# Patient Record
Sex: Male | Born: 1975 | Race: White | Hispanic: No | Marital: Single | Smoking: Never smoker
Health system: Southern US, Community
[De-identification: ages and names within clinical notes are randomized; demographics above are authoritative.]

## PROBLEM LIST (undated history)

## (undated) DIAGNOSIS — E291 Testicular hypofunction: Secondary | ICD-10-CM

## (undated) DIAGNOSIS — E559 Vitamin D deficiency, unspecified: Secondary | ICD-10-CM

## (undated) DIAGNOSIS — N400 Enlarged prostate without lower urinary tract symptoms: Secondary | ICD-10-CM

## (undated) DIAGNOSIS — R7989 Other specified abnormal findings of blood chemistry: Secondary | ICD-10-CM

## (undated) DIAGNOSIS — E785 Hyperlipidemia, unspecified: Secondary | ICD-10-CM

## (undated) DIAGNOSIS — K219 Gastro-esophageal reflux disease without esophagitis: Secondary | ICD-10-CM

## (undated) HISTORY — DX: Vitamin D deficiency, unspecified: E55.9

## (undated) HISTORY — DX: Other specified abnormal findings of blood chemistry: R79.89

## (undated) HISTORY — DX: Testicular hypofunction: E29.1

## (undated) HISTORY — DX: Benign prostatic hyperplasia without lower urinary tract symptoms: N40.0

## (undated) HISTORY — DX: Gastro-esophageal reflux disease without esophagitis: K21.9

## (undated) HISTORY — DX: Hyperlipidemia, unspecified: E78.5

## (undated) HISTORY — PX: SHOULDER SURGERY: SHX246

---

## 2007-04-18 ENCOUNTER — Emergency Department (HOSPITAL_COMMUNITY): Admission: EM | Admit: 2007-04-18 | Discharge: 2007-04-18 | Payer: Self-pay | Admitting: *Deleted

## 2013-04-27 DIAGNOSIS — D229 Melanocytic nevi, unspecified: Secondary | ICD-10-CM

## 2013-04-27 HISTORY — DX: Melanocytic nevi, unspecified: D22.9

## 2015-04-01 ENCOUNTER — Telehealth: Payer: Self-pay | Admitting: *Deleted

## 2015-04-01 DIAGNOSIS — R7989 Other specified abnormal findings of blood chemistry: Secondary | ICD-10-CM

## 2015-04-01 NOTE — Telephone Encounter (Signed)
Spoke w/pt and relayed Shannon's instructions: He will come in on 04/08/15 by 10:30 AM to have labs drawn for testosterone levels.  He is not out of his medication at this time. . .sm

## 2015-04-03 ENCOUNTER — Telehealth: Payer: Self-pay | Admitting: Urology

## 2015-04-03 NOTE — Telephone Encounter (Signed)
Spoke w/Mike at [facility].  Request was for needles and testosterone Rx's.  Request for needles was approved and signed by Michiel CowboyShannon Mcgowan PA-C and [facility]ed to [facility].  Pt is to have testosterone levels checked before refilling this Rx.  He is currently not out of this Rx.

## 2015-04-03 NOTE — Telephone Encounter (Signed)
Caleb Peters from [facility] called about the refill request on testosterone injection. that had been sent in twice for Pt. Best contact # [phone] 04/03/15 MAF

## 2015-04-04 ENCOUNTER — Other Ambulatory Visit: Payer: Self-pay | Admitting: *Deleted

## 2015-04-04 DIAGNOSIS — E291 Testicular hypofunction: Secondary | ICD-10-CM

## 2015-04-04 NOTE — Telephone Encounter (Signed)
Last filled on 04/12/2014.  Last OV 01/28/2015.

## 2015-04-08 ENCOUNTER — Ambulatory Visit: Payer: Commercial Managed Care - PPO

## 2015-04-08 ENCOUNTER — Other Ambulatory Visit: Payer: Commercial Managed Care - PPO

## 2015-04-08 DIAGNOSIS — E291 Testicular hypofunction: Secondary | ICD-10-CM

## 2015-04-09 ENCOUNTER — Telehealth: Payer: Self-pay

## 2015-04-09 DIAGNOSIS — E291 Testicular hypofunction: Secondary | ICD-10-CM

## 2015-04-09 LAB — TESTOSTERONE: Testosterone: 1358 ng/dL — ABNORMAL HIGH (ref 348–1197)

## 2015-04-09 MED ORDER — TESTOSTERONE CYPIONATE 200 MG/ML IM SOLN: 200.0000 mg | INTRAMUSCULAR | Status: DC

## 2015-04-09 NOTE — Telephone Encounter (Signed)
Spoke with pt and gave results. Pt will f/u in 2 weeks for next injection. Medication faxed to pharmacy. Cw,lpn

## 2015-04-09 NOTE — Telephone Encounter (Signed)
-----   Message from Harle Battiest, PA-C sent at 04/09/2015  8:33 AM EDT ----- Patient's testosterone is too high.  We will need to give the injections.  We will start with 0.cc every two weeks and then recheck his testosterone in the am (8-10am) in one month.

## 2015-04-22 ENCOUNTER — Ambulatory Visit: Payer: Self-pay

## 2015-04-26 ENCOUNTER — Ambulatory Visit (INDEPENDENT_AMBULATORY_CARE_PROVIDER_SITE_OTHER): Payer: Commercial Managed Care - PPO

## 2015-04-26 DIAGNOSIS — E291 Testicular hypofunction: Secondary | ICD-10-CM | POA: Diagnosis not present

## 2015-04-26 MED ORDER — TESTOSTERONE CYPIONATE 200 MG/ML IM SOLN
100.0000 mg | Freq: Once | INTRAMUSCULAR | Status: AC
  Administered 2015-04-26: 100 mg via INTRAMUSCULAR

## 2015-04-26 NOTE — Progress Notes (Signed)
Testosterone IM Injection  Due to Hypogonadism patient is present today for a Testosterone Injection.  Medication: Testosterone Cypionate Dose: 0.615ml /100mg  Location: left upper outer buttocks Lot: [id number] Exp:01/2016  Patient tolerated well, no complications were noted  Preformed by: Eligha BridegroomSarah Watts, CMA  Follow up: 2wk injection

## 2015-05-13 ENCOUNTER — Ambulatory Visit (INDEPENDENT_AMBULATORY_CARE_PROVIDER_SITE_OTHER): Payer: Commercial Managed Care - PPO

## 2015-05-13 DIAGNOSIS — E291 Testicular hypofunction: Secondary | ICD-10-CM

## 2015-05-13 MED ORDER — TESTOSTERONE CYPIONATE 200 MG/ML IM SOLN
200.0000 mg | Freq: Once | INTRAMUSCULAR | Status: AC
  Administered 2015-05-13: 200 mg via INTRAMUSCULAR

## 2015-05-13 NOTE — Progress Notes (Signed)
Testosterone IM Injection  Due to Hypogonadism patient is present today for a Testosterone Injection.  Medication: Testosterone Cypionate Dose: 0.285mL Location: right upper outer buttocks Lot: [id number] Exp:01/2016  Patient tolerated well, no complications were noted  Preformed by: Rupert Stackshelsea Jerlisa Diliberto, LPN   Follow up: 2 weeks

## 2015-05-27 ENCOUNTER — Ambulatory Visit (INDEPENDENT_AMBULATORY_CARE_PROVIDER_SITE_OTHER): Payer: Commercial Managed Care - PPO

## 2015-05-27 DIAGNOSIS — E291 Testicular hypofunction: Secondary | ICD-10-CM

## 2015-05-27 MED ORDER — TESTOSTERONE CYPIONATE 200 MG/ML IM SOLN
200.0000 mg | Freq: Once | INTRAMUSCULAR | Status: AC
  Administered 2015-05-27: 200 mg via INTRAMUSCULAR

## 2015-05-27 NOTE — Progress Notes (Signed)
Testosterone IM Injection  Due to Hypogonadism patient is present today for a Testosterone Injection.  Medication: Testosterone Cypionate Dose: 0.96mL Location: left upper outer buttocks Lot: [id number] Exp:01/2016  Patient tolerated well, no complications were noted  Preformed by: Rupert Stacks, LPN  Follow up: 2 weeks

## 2015-06-10 ENCOUNTER — Ambulatory Visit (INDEPENDENT_AMBULATORY_CARE_PROVIDER_SITE_OTHER): Payer: Commercial Managed Care - PPO

## 2015-06-10 DIAGNOSIS — E291 Testicular hypofunction: Secondary | ICD-10-CM | POA: Diagnosis not present

## 2015-06-10 MED ORDER — TESTOSTERONE CYPIONATE 200 MG/ML IM SOLN
200.0000 mg | Freq: Once | INTRAMUSCULAR | Status: AC
  Administered 2015-06-10: 200 mg via INTRAMUSCULAR

## 2015-06-10 NOTE — Progress Notes (Signed)
Testosterone IM Injection  Due to Hypogonadism patient is present today for a Testosterone Injection.  Medication: Testosterone Cypionate Dose: 0.75mL Location: right upper outer buttocks Lot: [id number] Exp:01/2016  Patient tolerated well, no complications were noted  Preformed by: Rupert Stacks, LPN  Follow up: Pt needs more testosterone before next injection. Pt made aware. Pt stated he has 1 more refill.

## 2015-06-24 ENCOUNTER — Ambulatory Visit: Payer: Commercial Managed Care - PPO

## 2015-06-28 ENCOUNTER — Ambulatory Visit: Payer: Commercial Managed Care - PPO

## 2015-07-02 ENCOUNTER — Ambulatory Visit: Payer: Commercial Managed Care - PPO

## 2015-07-08 ENCOUNTER — Ambulatory Visit (INDEPENDENT_AMBULATORY_CARE_PROVIDER_SITE_OTHER): Payer: Commercial Managed Care - PPO

## 2015-07-08 DIAGNOSIS — E291 Testicular hypofunction: Secondary | ICD-10-CM

## 2015-07-08 MED ORDER — TESTOSTERONE CYPIONATE 200 MG/ML IM SOLN
200.0000 mg | Freq: Once | INTRAMUSCULAR | Status: AC
  Administered 2015-07-08: 200 mg via INTRAMUSCULAR

## 2015-07-08 NOTE — Progress Notes (Signed)
Testosterone IM Injection  Due to Hypogonadism patient is present today for a Testosterone Injection.  Medication: Testosterone Cypionate Dose: 0.78mL Location: left quadricep Lot: [id number] Exp:04/2016  Patient tolerated well, no complications were noted  Preformed by: Rupert Stacks, LPN

## 2015-07-22 ENCOUNTER — Ambulatory Visit (INDEPENDENT_AMBULATORY_CARE_PROVIDER_SITE_OTHER): Payer: Commercial Managed Care - PPO

## 2015-07-22 DIAGNOSIS — E291 Testicular hypofunction: Secondary | ICD-10-CM

## 2015-07-22 MED ORDER — TESTOSTERONE CYPIONATE 200 MG/ML IM SOLN
200.0000 mg | Freq: Once | INTRAMUSCULAR | Status: AC
  Administered 2015-07-22: 200 mg via INTRAMUSCULAR

## 2015-07-22 NOTE — Progress Notes (Signed)
Testosterone IM Injection  Due to Hypogonadism patient is present today for a Testosterone Injection.  Medication: Testosterone Cypionate Dose: 0.39mL Location: right upper outer quadricept Lot: [id number] Exp:04/2016  Patient tolerated well, no complications were noted  Preformed by: Rupert Stacks, LPN  Follow up: 2 weeks

## 2015-07-23 ENCOUNTER — Telehealth: Payer: Self-pay | Admitting: *Deleted

## 2015-07-23 ENCOUNTER — Other Ambulatory Visit: Payer: Self-pay | Admitting: *Deleted

## 2015-07-23 DIAGNOSIS — E291 Testicular hypofunction: Secondary | ICD-10-CM

## 2015-07-23 NOTE — Telephone Encounter (Signed)
Because of  patient non compliance with every 3 month lab draws while on testosterone, per [facility] Patient is not to receive any more injections and will follow up in one month with a before 9 am lab draw for Testosterone and a Hematocrit and after results Carollee Herter will proceed with what will be his plan.

## 2015-08-05 ENCOUNTER — Ambulatory Visit: Payer: Self-pay

## 2015-08-21 ENCOUNTER — Other Ambulatory Visit: Payer: Commercial Managed Care - PPO

## 2015-09-06 ENCOUNTER — Other Ambulatory Visit: Payer: Commercial Managed Care - PPO

## 2015-11-01 ENCOUNTER — Telehealth: Payer: Self-pay | Admitting: Urology

## 2015-11-01 NOTE — Telephone Encounter (Signed)
Pt called, has new insurance and would like to start back on his testosterone injections.  Does the patient need to come in for a testosterone level and/or appt with Carollee HerterShannon before he starts back on his injections.  Please call and advise.  [phone].

## 2015-11-01 NOTE — Telephone Encounter (Signed)
LMOM

## 2015-11-01 NOTE — Telephone Encounter (Signed)
We have new protocols and place for testosterone therapy due to the FDA's increased regulation of testosterone therapy.  He will need to come in for blood work 1 week prior to an office visit.  He will need a TSH, lipid panel, hepatic panel, PSA, hematocrit and a testosterone level before 9 AM.  These do not need to be fasting labs.  He then will need an appointment with me or Lillia AbedLindsay.

## 2015-11-05 NOTE — Telephone Encounter (Signed)
LMOM for patient to call back.

## 2015-11-06 ENCOUNTER — Other Ambulatory Visit: Payer: Self-pay | Admitting: Urology

## 2015-11-06 ENCOUNTER — Telehealth: Payer: Self-pay | Admitting: Urology

## 2015-11-06 DIAGNOSIS — E291 Testicular hypofunction: Secondary | ICD-10-CM

## 2015-11-06 NOTE — Telephone Encounter (Signed)
I spoke w/Darlene at Dr. Carron BrazenNiemeyer's office and pt needs Thyroid, Cholesterol, Vitamin D and CBC.  He is coming Friday morning at 8:45 for labs.

## 2015-11-08 ENCOUNTER — Other Ambulatory Visit: Payer: 59

## 2015-11-08 DIAGNOSIS — E291 Testicular hypofunction: Secondary | ICD-10-CM

## 2015-11-09 LAB — CBC WITH DIFFERENTIAL/PLATELET
BASOS ABS: 0 10*3/uL (ref 0.0–0.2)
BASOS: 0 %
EOS (ABSOLUTE): 0.2 10*3/uL (ref 0.0–0.4)
Eos: 4 %
HEMOGLOBIN: 15.9 g/dL (ref 12.6–17.7)
Hematocrit: 45 % (ref 37.5–51.0)
Immature Grans (Abs): 0 10*3/uL (ref 0.0–0.1)
Immature Granulocytes: 0 %
LYMPHS ABS: 1.6 10*3/uL (ref 0.7–3.1)
Lymphs: 32 %
MCH: 32.1 pg (ref 26.6–33.0)
MCHC: 35.3 g/dL (ref 31.5–35.7)
MCV: 91 fL (ref 79–97)
MONOCYTES: 5 %
Monocytes Absolute: 0.3 10*3/uL (ref 0.1–0.9)
NEUTROS ABS: 3.1 10*3/uL (ref 1.4–7.0)
Neutrophils: 59 %
Platelets: 172 10*3/uL (ref 150–379)
RBC: 4.95 x10E6/uL (ref 4.14–5.80)
RDW: 12.5 % (ref 12.3–15.4)
WBC: 5.2 10*3/uL (ref 3.4–10.8)

## 2015-11-09 LAB — COMPREHENSIVE METABOLIC PANEL
A/G RATIO: 2.5 (ref 1.1–2.5)
ALBUMIN: 5 g/dL (ref 3.5–5.5)
ALT: 30 IU/L (ref 0–44)
AST: 26 IU/L (ref 0–40)
Alkaline Phosphatase: 53 IU/L (ref 39–117)
BILIRUBIN TOTAL: 0.7 mg/dL (ref 0.0–1.2)
BUN / CREAT RATIO: 16 (ref 9–20)
BUN: 18 mg/dL (ref 6–24)
CALCIUM: 9.7 mg/dL (ref 8.7–10.2)
CHLORIDE: 101 mmol/L (ref 96–106)
CO2: 25 mmol/L (ref 18–29)
Creatinine, Ser: 1.12 mg/dL (ref 0.76–1.27)
GFR, EST AFRICAN AMERICAN: 94 mL/min/{1.73_m2} (ref >59)
GFR, EST NON AFRICAN AMERICAN: 82 mL/min/{1.73_m2} (ref >59)
GLUCOSE: 85 mg/dL (ref 65–99)
Globulin, Total: 2 g/dL (ref 1.5–4.5)
Potassium: 4.2 mmol/L (ref 3.5–5.2)
Sodium: 142 mmol/L (ref 134–144)
TOTAL PROTEIN: 7 g/dL (ref 6.0–8.5)

## 2015-11-09 LAB — LIPID PANEL
Chol/HDL Ratio: 2.2 ratio units (ref 0.0–5.0)
Cholesterol, Total: 195 mg/dL (ref 100–199)
HDL: 87 mg/dL (ref >39)
LDL Calculated: 85 mg/dL (ref 0–99)
TRIGLYCERIDES: 113 mg/dL (ref 0–149)
VLDL Cholesterol Cal: 23 mg/dL (ref 5–40)

## 2015-11-09 LAB — PSA: PROSTATE SPECIFIC AG, SERUM: 0.3 ng/mL (ref 0.0–4.0)

## 2015-11-09 LAB — TESTOSTERONE: Testosterone: 266 ng/dL — ABNORMAL LOW (ref 348–1197)

## 2015-11-09 LAB — TSH: TSH: 2.56 u[IU]/mL (ref 0.450–4.500)

## 2015-11-11 ENCOUNTER — Encounter: Payer: Self-pay | Admitting: *Deleted

## 2015-11-11 NOTE — Telephone Encounter (Signed)
Spoke with patient and gave message patient already came for blood work and has appointment tomorrow to talk with Carollee HerterShannon about restarting injections. Patient ok with plan

## 2015-11-12 ENCOUNTER — Encounter: Payer: Self-pay | Admitting: Urology

## 2015-11-12 ENCOUNTER — Ambulatory Visit (INDEPENDENT_AMBULATORY_CARE_PROVIDER_SITE_OTHER): Payer: 59 | Admitting: Urology

## 2015-11-12 VITALS — BP 129/88 | HR 65 | Ht 74.0 in | Wt 225.8 lb

## 2015-11-12 DIAGNOSIS — E291 Testicular hypofunction: Secondary | ICD-10-CM | POA: Insufficient documentation

## 2015-11-12 LAB — VITAMIN D 1,25 DIHYDROXY
Vitamin D 1, 25 (OH)2 Total: 70 pg/mL
Vitamin D2 1, 25 (OH)2: <10 pg/mL
Vitamin D3 1, 25 (OH)2: 68 pg/mL

## 2015-11-12 MED ORDER — TESTOSTERONE CYPIONATE 200 MG/ML IM SOLN
200.0000 mg | Freq: Once | INTRAMUSCULAR | Status: AC
  Administered 2015-11-12: 200 mg via INTRAMUSCULAR

## 2015-11-12 NOTE — Progress Notes (Signed)
11/12/2015 10:09 AM   Caleb Peters 04-25-1976 [id number]  Referring provider: Domenic Schwab, FNP [address] [place], [place] [address]  Chief Complaint  Patient presents with  . Hypogonadism    discuss injections    HPI: Patient is a 40 year old Caucasian male with a history of hypogonadism who was managing his condition with testosterone cypionate injections but lost his insurance and had to discontinue therapy.  He would like to restart the injections at this time.    Hypogonadism Patient is with the symptoms of decreased energy and motivation and mood changes.  This is indicated by his responses to the ADAM questionnaire.   His current testosterone level is 266 ng/dL on 57/84/6962.  He was currently managing his hypogonadism with testosterone cypionate injections and would like to reinstate this therapy.   He would like to have his injections every other Monday.   His TSH, lipid panel, LFT, PSA and HCT are all within normal limits.      Androgen Deficiency in the Aging Male      11/12/15 0900       Androgen Deficiency in the Aging Male   Do you have a decrease in libido (sex drive) No     Do you have lack of energy Yes     Do you have a decrease in strength and/or endurance Yes     Have you lost height No     Have you noticed a decreased "enjoyment of life" No     Are you sad and/or grumpy No     Are your erections less strong No     Have you noticed a recent deterioration in your ability to play sports No     Are you falling asleep after dinner No     Has there been a recent deterioration in your work performance No        He is not experiencing any difficulty with sexual function.  He is not experiencing any urinary symptoms at this time.  His IPSS score is 0/0.      IPSS      11/12/15 0900       International Prostate Symptom Score   How often have you had the sensation of not emptying your bladder? Not at All     How often have you had to urinate  less than every two hours? Not at All     How often have you found you stopped and started again several times when you urinated? Not at All     How often have you found it difficult to postpone urination? Not at All     How often have you had a weak urinary stream? Not at All     How often have you had to strain to start urination? Not at All     How many times did you typically get up at night to urinate? None     Total IPSS Score 0     Quality of Life due to urinary symptoms   If you were to spend the rest of your life with your urinary condition just the way it is now how would you feel about that? Delighted        Score:  1-7 Mild 8-19 Moderate 20-35 Severe   PMH: Past Medical History  Diagnosis Date  . HLD (hyperlipidemia)   . Esophageal reflux   . Vitamin D deficiency   . Low testosterone   . Hypogonadism  in male   . BPH (benign prostatic hypertrophy)     Surgical History: Past Surgical History  Procedure Laterality Date  . Shoulder surgery      Home Medications:    Medication List       This list is accurate as of: 11/12/15 10:09 AM.  Always use your most recent med list.               lisinopril 10 MG tablet  Commonly known as:  PRINIVIL,ZESTRIL  Take 10 mg by mouth daily.     rosuvastatin 10 MG tablet  Commonly known as:  CRESTOR  Take 10 mg by mouth daily.     testosterone cypionate 200 MG/ML injection  Commonly known as:  DEPOTESTOSTERONE CYPIONATE  Inject 1 mL (200 mg total) into the muscle every 14 (fourteen) days. Inject 0.79mL every 2 weeks        Allergies: No Known Allergies  Family History: Family History  Problem Relation Age of Onset  . Cancer Mother   . Hyperlipidemia Mother   . Prostate cancer Maternal Grandfather     Social History:  reports that he has never smoked. He does not have any smokeless tobacco history on file. He reports that he drinks alcohol. He reports that he does not use illicit  drugs.  ROS: UROLOGY Frequent Urination?: No Hard to postpone urination?: No Burning/pain with urination?: No Get up at night to urinate?: No Leakage of urine?: No Urine stream starts and stops?: No Trouble starting stream?: No Do you have to strain to urinate?: No Blood in urine?: No Urinary tract infection?: No Sexually transmitted disease?: No Injury to kidneys or bladder?: No Painful intercourse?: No Weak stream?: No Erection problems?: No Penile pain?: No  Gastrointestinal Nausea?: No Vomiting?: No Indigestion/heartburn?: No Diarrhea?: No Constipation?: No  Constitutional Fever: No Night sweats?: No Weight loss?: No Fatigue?: No  Skin Skin rash/lesions?: No Itching?: No  Eyes Blurred vision?: No Double vision?: No  Ears/Nose/Throat Sore throat?: No Sinus problems?: No  Hematologic/Lymphatic Swollen glands?: No Easy bruising?: No  Cardiovascular Leg swelling?: No Chest pain?: No  Respiratory Cough?: No Shortness of breath?: No  Endocrine Excessive thirst?: No  Musculoskeletal Back pain?: No Joint pain?: No  Neurological Headaches?: No Dizziness?: No  Psychologic Depression?: No Anxiety?: No  Physical Exam: BP 129/88 mmHg  Pulse 65  Ht  (1.88 m)  Wt 225 lb 12.8 oz (102.422 kg)  BMI 28.98 kg/m2  Constitutional: Well nourished. Alert and oriented, No acute distress. HEENT: [place] AT, moist mucus membranes. Trachea midline, no masses. Cardiovascular: No clubbing, cyanosis, or edema. Respiratory: Normal respiratory effort, no increased work of breathing. GI: Abdomen is soft, non tender, non distended, no abdominal masses. Liver and spleen not palpable.  No hernias appreciated.  Stool sample for occult testing is not indicated.   GU: No CVA tenderness.  No bladder fullness or masses.  Patient with circumcised phallus.   Urethral meatus is patent.  No penile discharge. No penile lesions or rashes. Scrotum without lesions, cysts,  rashes and/or edema.  Testicles are located scrotally bilaterally. No masses are appreciated in the testicles. Left and right epididymis are normal. Rectal: Patient with  normal sphincter tone. Anus and perineum without scarring or rashes. No rectal masses are appreciated. Prostate is approximately 35 grams, no nodules are appreciated. Seminal vesicles are normal. Skin: No rashes, bruises or suspicious lesions. Lymph: No cervical or inguinal adenopathy. Neurologic: Grossly intact, no focal deficits, moving all 4 extremities.  Psychiatric: Normal mood and affect.  Laboratory Data: Lab Results  Component Value Date   WBC 5.2 11/08/2015   HCT 45.0 11/08/2015   MCV 91 11/08/2015   PLT 172 11/08/2015    Lab Results  Component Value Date   CREATININE 1.12 11/08/2015    Lab Results  Component Value Date   TESTOSTERONE 266* 11/08/2015    Lab Results  Component Value Date   TSH 2.560 11/08/2015   Lab Results  Component Value Date   AST 26 11/08/2015   Lab Results  Component Value Date   ALT 30 11/08/2015    Assessment & Plan:    1. Hypogonadism:   Patient will reinstate his testosterone cypionate injections today. He will receive 1 cc. He would like injections to occur every other Monday as this is convenient to his work schedule.  He will return in 3 months for hematocrit and testosterone level blood draw. In 6 months, he will return for an office visit with a providers for I PSS score, ADAM questionnaire, exam, PSA, hematocrit and testosterone level  Return in about 3 months (around 02/10/2016) for HCT and testosterone level.  These notes generated with voice recognition software. I apologize for typographical errors.  Michiel Cowboy, PA-C  [facility] [address], [address] [place], [place] [address] [phone]

## 2015-11-12 NOTE — Progress Notes (Signed)
Testosterone IM Injection  Due to Hypogonadism patient is present today for a Testosterone Injection.  Medication: Testosterone Cypionate Dose: 0.98mL Location: left upper outer buttocks Lot: [id number] Exp:04/2016  Patient tolerated well, no complications were noted  Preformed by: Rupert Stacks, LPN

## 2015-11-25 ENCOUNTER — Ambulatory Visit (INDEPENDENT_AMBULATORY_CARE_PROVIDER_SITE_OTHER): Payer: 59

## 2015-11-25 DIAGNOSIS — E291 Testicular hypofunction: Secondary | ICD-10-CM

## 2015-11-25 MED ORDER — TESTOSTERONE CYPIONATE 200 MG/ML IM SOLN
200.0000 mg | Freq: Once | INTRAMUSCULAR | Status: AC
  Administered 2015-11-25: 200 mg via INTRAMUSCULAR

## 2015-11-25 NOTE — Progress Notes (Signed)
Testosterone IM Injection  Due to Hypogonadism patient is present today for a Testosterone Injection.  Medication: Testosterone Cypionate Dose: 1mL Location: right upper outer buttocks Lot: [id number] Exp:04/2016  Patient tolerated well, no complications were noted  Preformed by: Rupert Stacks, LPN

## 2015-12-09 ENCOUNTER — Ambulatory Visit (INDEPENDENT_AMBULATORY_CARE_PROVIDER_SITE_OTHER): Payer: 59

## 2015-12-09 DIAGNOSIS — E291 Testicular hypofunction: Secondary | ICD-10-CM | POA: Diagnosis not present

## 2015-12-09 MED ORDER — TESTOSTERONE CYPIONATE 200 MG/ML IM SOLN
200.0000 mg | Freq: Once | INTRAMUSCULAR | Status: AC
  Administered 2015-12-09: 200 mg via INTRAMUSCULAR

## 2015-12-09 NOTE — Progress Notes (Signed)
Testosterone IM Injection  Due to Hypogonadism patient is present today for a Testosterone Injection.  Medication: Testosterone Cypionate Dose: 1mL Location: left upper outer buttocks Lot: [id number] Exp:04/2016  Patient tolerated well, no complications were noted  Preformed by: Rupert Stacks, LPN   Follow up: 2 weeks

## 2015-12-23 ENCOUNTER — Ambulatory Visit (INDEPENDENT_AMBULATORY_CARE_PROVIDER_SITE_OTHER): Payer: 59

## 2015-12-23 DIAGNOSIS — E291 Testicular hypofunction: Secondary | ICD-10-CM | POA: Diagnosis not present

## 2015-12-23 MED ORDER — TESTOSTERONE CYPIONATE 200 MG/ML IM SOLN
200.0000 mg | Freq: Once | INTRAMUSCULAR | Status: AC
  Administered 2015-12-23: 200 mg via INTRAMUSCULAR

## 2015-12-23 NOTE — Progress Notes (Signed)
Testosterone IM Injection  Due to Hypogonadism patient is present today for a Testosterone Injection.  Medication: Testosterone Cypionate Dose: 1mL Location: right upper outer buttocks Lot: [id number] Exp:04/2016  Patient tolerated well, no complications were noted  Preformed by: Rupert Stacks, LPN   Follow up: 2 weeks

## 2016-01-06 ENCOUNTER — Ambulatory Visit: Payer: 59

## 2016-01-20 ENCOUNTER — Ambulatory Visit (INDEPENDENT_AMBULATORY_CARE_PROVIDER_SITE_OTHER): Payer: 59

## 2016-01-20 DIAGNOSIS — E291 Testicular hypofunction: Secondary | ICD-10-CM | POA: Diagnosis not present

## 2016-01-20 MED ORDER — TESTOSTERONE CYPIONATE 200 MG/ML IM SOLN
200.0000 mg | Freq: Once | INTRAMUSCULAR | Status: AC
  Administered 2016-01-20: 200 mg via INTRAMUSCULAR

## 2016-01-20 NOTE — Progress Notes (Signed)
Testosterone IM Injection  Due to Hypogonadism patient is present today for a Testosterone Injection.  Medication: Testosterone Cypionate Dose: 1mL Location: right upper outer buttocks Lot: [id number] Exp:04/2016  Patient tolerated well, no complications were noted  Preformed by: Rupert Stackshelsea Concettina Leth, LPN   Follow up: 2 weeks

## 2016-02-03 ENCOUNTER — Ambulatory Visit (INDEPENDENT_AMBULATORY_CARE_PROVIDER_SITE_OTHER): Payer: 59

## 2016-02-03 DIAGNOSIS — E291 Testicular hypofunction: Secondary | ICD-10-CM

## 2016-02-03 MED ORDER — TESTOSTERONE CYPIONATE 200 MG/ML IM SOLN
200.0000 mg | Freq: Once | INTRAMUSCULAR | Status: AC
  Administered 2016-02-03: 200 mg via INTRAMUSCULAR

## 2016-02-03 NOTE — Progress Notes (Signed)
Testosterone IM Injection  Due to Hypogonadism patient is present today for a Testosterone Injection.  Medication: Testosterone Cypionate Dose: 1ml Location: left upper outer buttocks Lot: [id number]  Exp:04/2016  Patient tolerated well, no complications were noted  Preformed by: K.Deziree Mokry,CMA  Follow up: 2 weeks

## 2016-02-17 ENCOUNTER — Other Ambulatory Visit: Payer: Self-pay

## 2016-02-17 ENCOUNTER — Ambulatory Visit: Payer: 59

## 2016-02-17 DIAGNOSIS — E291 Testicular hypofunction: Secondary | ICD-10-CM

## 2016-02-17 MED ORDER — TESTOSTERONE CYPIONATE 200 MG/ML IM SOLN
200.0000 mg | Freq: Once | INTRAMUSCULAR | Status: AC
  Administered 2016-02-17: 200 mg via INTRAMUSCULAR

## 2016-02-17 NOTE — Progress Notes (Unsigned)
Testosterone IM Injection  Due to Hypogonadism patient is present today for a Testosterone Injection.  Medication: Testosterone Cypionate Dose: 1mL Location: right upper outer buttocks Lot: [id number] Exp: 04/2016  Patient tolerated well, no complications were noted  Preformed by: Anson Oregonachelle Heith Haigler, CMA  Follow up: 2 weeks.

## 2016-02-18 ENCOUNTER — Telehealth: Payer: Self-pay

## 2016-02-18 DIAGNOSIS — E291 Testicular hypofunction: Secondary | ICD-10-CM

## 2016-02-18 LAB — TESTOSTERONE: TESTOSTERONE: 289 ng/dL — AB (ref 348–1197)

## 2016-02-18 LAB — HEMATOCRIT: HEMATOCRIT: 49.6 % (ref 37.5–51.0)

## 2016-02-18 NOTE — Telephone Encounter (Signed)
-----   Message from Harle BattiestShannon A McGowan, PA-C sent at 02/18/2016  8:12 AM EDT ----- Patient's testosterone level in low, but the blood was drawn during his trough.  I need to have him have his blood drawn one week after he receives his shot.  It does not need to be a morning blood draw.

## 2016-02-18 NOTE — Telephone Encounter (Signed)
Spoke with pt in reference to testosterone results. Pt will RTC next Monday for labs.

## 2016-02-24 ENCOUNTER — Other Ambulatory Visit: Payer: 59

## 2016-02-24 DIAGNOSIS — E291 Testicular hypofunction: Secondary | ICD-10-CM

## 2016-02-25 ENCOUNTER — Telehealth: Payer: Self-pay | Admitting: Urology

## 2016-02-25 ENCOUNTER — Telehealth: Payer: Self-pay

## 2016-02-25 LAB — TESTOSTERONE: Testosterone: 726 ng/dL (ref 348–1197)

## 2016-02-25 NOTE — Telephone Encounter (Signed)
LMOM- labs are within normal range. Will need an office visit in July.

## 2016-02-25 NOTE — Telephone Encounter (Signed)
Pt would like for you to give him a call with testosterone results.

## 2016-02-25 NOTE — Telephone Encounter (Signed)
-----   Message from Harle BattiestShannon A McGowan, PA-C sent at 02/25/2016 11:54 AM EDT ----- Testosterone is good. Continue present dose.  I will need to see him in July for an office visit with an ADAM, SHIM, IPSS, PSA, HCT and testostorone level.  Blood work should be obtained one week after he receives his injection.

## 2016-03-02 ENCOUNTER — Ambulatory Visit: Payer: 59

## 2016-03-09 ENCOUNTER — Ambulatory Visit (INDEPENDENT_AMBULATORY_CARE_PROVIDER_SITE_OTHER): Payer: 59

## 2016-03-09 ENCOUNTER — Telehealth: Payer: Self-pay

## 2016-03-09 DIAGNOSIS — E291 Testicular hypofunction: Secondary | ICD-10-CM

## 2016-03-09 MED ORDER — TESTOSTERONE CYPIONATE 200 MG/ML IM SOLN
200.0000 mg | Freq: Once | INTRAMUSCULAR | Status: AC
  Administered 2016-03-09: 200 mg via INTRAMUSCULAR

## 2016-03-09 NOTE — Progress Notes (Signed)
Testosterone IM Injection  Due to Hypogonadism patient is present today for a Testosterone Injection.  Medication: Testosterone Cypionate Dose: 1mL Location: right upper outer buttocks Lot: [id number] Exp:04/2016  Patient tolerated well, no complications were noted  Preformed by: Rupert Stackshelsea Watkins, LPN   Follow up: Pt needs a refill on medication. Office visit is on the books.

## 2016-03-09 NOTE — Telephone Encounter (Signed)
Pt came in today for an injection. Pt needs a refill on medication. Labs were just drawn and a f/u is on the books. Please advise.

## 2016-03-09 NOTE — Telephone Encounter (Signed)
I would like to wait on the labs.

## 2016-03-11 MED ORDER — TESTOSTERONE CYPIONATE 200 MG/ML IM SOLN: 200.0000 mg | INTRAMUSCULAR | Status: DC

## 2016-03-11 NOTE — Telephone Encounter (Signed)
Labs were drawn on 02/24/16.

## 2016-03-11 NOTE — Telephone Encounter (Signed)
Done

## 2016-03-30 ENCOUNTER — Ambulatory Visit (INDEPENDENT_AMBULATORY_CARE_PROVIDER_SITE_OTHER): Payer: 59

## 2016-03-30 DIAGNOSIS — E291 Testicular hypofunction: Secondary | ICD-10-CM | POA: Diagnosis not present

## 2016-03-30 MED ORDER — TESTOSTERONE CYPIONATE 200 MG/ML IM SOLN
200.0000 mg | Freq: Once | INTRAMUSCULAR | Status: AC
  Administered 2016-03-30: 200 mg via INTRAMUSCULAR

## 2016-03-30 NOTE — Progress Notes (Signed)
Testosterone IM Injection  Due to Hypogonadism patient is present today for a Testosterone Injection.  Medication: Testosterone Cypionate Dose: 1mL Location: left upper outer buttocks Lot: [id number] Exp:09/2017  Patient tolerated well, no complications were noted  Preformed by: Freddrick Gladson, LPN     

## 2016-04-13 ENCOUNTER — Ambulatory Visit (INDEPENDENT_AMBULATORY_CARE_PROVIDER_SITE_OTHER): Payer: 59

## 2016-04-13 ENCOUNTER — Ambulatory Visit: Payer: 59

## 2016-04-13 DIAGNOSIS — E291 Testicular hypofunction: Secondary | ICD-10-CM

## 2016-04-13 MED ORDER — TESTOSTERONE CYPIONATE 200 MG/ML IM SOLN
200.0000 mg | Freq: Once | INTRAMUSCULAR | Status: AC
  Administered 2016-04-13: 200 mg via INTRAMUSCULAR

## 2016-04-13 NOTE — Progress Notes (Signed)
Testosterone IM Injection  Due to Hypogonadism patient is present today for a Testosterone Injection.  Medication: Testosterone Cypionate Dose: 1mL Location: right upper outer buttocks Lot: [id number]  Exp:09/2017  Patient tolerated well, no complications were noted  Preformed by: K.Damain Broadus,CMA  Follow up: 1 week f/u labs (Testosterone ). 2 weeks f/u with Depo injection.   Pt informed he needs to get a refill of his medication when he returns as well for his injction.

## 2016-04-20 ENCOUNTER — Other Ambulatory Visit: Payer: 59

## 2016-04-20 DIAGNOSIS — Z79899 Other long term (current) drug therapy: Secondary | ICD-10-CM

## 2016-04-20 DIAGNOSIS — E291 Testicular hypofunction: Secondary | ICD-10-CM

## 2016-04-21 ENCOUNTER — Telehealth: Payer: Self-pay

## 2016-04-21 DIAGNOSIS — E291 Testicular hypofunction: Secondary | ICD-10-CM

## 2016-04-21 LAB — TESTOSTERONE: TESTOSTERONE: 910 ng/dL (ref 348–1197)

## 2016-04-21 LAB — HEPATIC FUNCTION PANEL
ALT: 22 IU/L (ref 0–44)
AST: 34 IU/L (ref 0–40)
Albumin: 4.7 g/dL (ref 3.5–5.5)
Alkaline Phosphatase: 58 IU/L (ref 39–117)
BILIRUBIN, DIRECT: 0.21 mg/dL (ref 0.00–0.40)
Bilirubin Total: 0.8 mg/dL (ref 0.0–1.2)
TOTAL PROTEIN: 6.8 g/dL (ref 6.0–8.5)

## 2016-04-21 LAB — ESTRADIOL: ESTRADIOL: 46.8 pg/mL — AB (ref 7.6–42.6)

## 2016-04-21 LAB — PSA: PROSTATE SPECIFIC AG, SERUM: 0.5 ng/mL (ref 0.0–4.0)

## 2016-04-21 MED ORDER — TESTOSTERONE CYPIONATE 200 MG/ML IM SOLN: 200.0000 mg | INTRAMUSCULAR | Status: AC

## 2016-04-21 NOTE — Telephone Encounter (Signed)
Okay to refill? 

## 2016-04-21 NOTE — Telephone Encounter (Signed)
Spoke with pt and made aware of lab results. Pt voiced understanding. Pt stated that he is out of medication. Please advise.

## 2016-04-21 NOTE — Telephone Encounter (Signed)
Medication was sent to pharmacy.

## 2016-04-21 NOTE — Telephone Encounter (Signed)
LMOM

## 2016-04-21 NOTE — Telephone Encounter (Signed)
-----   Message from Harle BattiestShannon A McGowan, PA-C sent at 04/21/2016  8:28 AM EDT ----- Patient's estradiol level is elevated.  We need to decrease his injections to 0.5 mL and recheck his estradiol levels and testosterone levels in 3 months.

## 2016-04-22 ENCOUNTER — Telehealth: Payer: Self-pay | Admitting: Urology

## 2016-04-22 NOTE — Telephone Encounter (Signed)
Pt called and wants to know about prescription and insurance.  Please give him a call [phone]

## 2016-04-24 NOTE — Telephone Encounter (Signed)
Spoke with pt in reference to testosterone medication. Pt questions were answered and pt voiced understanding.

## 2016-04-27 ENCOUNTER — Ambulatory Visit (INDEPENDENT_AMBULATORY_CARE_PROVIDER_SITE_OTHER): Payer: 59

## 2016-04-27 DIAGNOSIS — E291 Testicular hypofunction: Secondary | ICD-10-CM

## 2016-04-27 MED ORDER — TESTOSTERONE CYPIONATE 200 MG/ML IM SOLN
200.0000 mg | Freq: Once | INTRAMUSCULAR | Status: AC
  Administered 2016-04-27: 200 mg via INTRAMUSCULAR

## 2016-04-27 NOTE — Progress Notes (Signed)
Testosterone IM Injection  Due to Hypogonadism patient is present today for a Testosterone Injection.  Medication: Testosterone Cypionate Dose: 0.525mL Location: left upper outer buttocks Lot: [id number] Exp:11/2017  Patient tolerated well, no complications were noted  Preformed by: Rupert Stackshelsea Watkins, LPN  Follow up: pt started 0.535mL today

## 2016-04-29 ENCOUNTER — Ambulatory Visit: Payer: 59

## 2016-05-11 ENCOUNTER — Ambulatory Visit: Payer: 59 | Admitting: Urology

## 2016-06-10 ENCOUNTER — Ambulatory Visit: Payer: 59 | Admitting: Urology

## 2017-01-01 ENCOUNTER — Other Ambulatory Visit: Payer: Self-pay

## 2017-01-01 ENCOUNTER — Other Ambulatory Visit: Payer: Self-pay | Admitting: Physician Assistant

## 2017-01-01 DIAGNOSIS — G8929 Other chronic pain: Secondary | ICD-10-CM

## 2017-01-01 DIAGNOSIS — M25561 Pain in right knee: Secondary | ICD-10-CM

## 2017-01-01 DIAGNOSIS — M2391 Unspecified internal derangement of right knee: Secondary | ICD-10-CM

## 2017-01-08 ENCOUNTER — Ambulatory Visit
Admission: RE | Admit: 2017-01-08 | Discharge: 2017-01-08 | Disposition: A | Discharge status: Home / Self Care | Payer: 59 | Source: Ambulatory Visit | Attending: Physician Assistant | Admitting: Physician Assistant

## 2017-01-08 DIAGNOSIS — G8929 Other chronic pain: Secondary | ICD-10-CM

## 2017-01-08 DIAGNOSIS — M2391 Unspecified internal derangement of right knee: Secondary | ICD-10-CM

## 2017-01-08 DIAGNOSIS — M25561 Pain in right knee: Secondary | ICD-10-CM

## 2017-06-23 IMAGING — MR MR KNEE*R* W/O CM
5 of 6 series · 33 of 40 positions shown · non-contrast
Comparison: None.

CLINICAL DATA: Right knee pain with painful range of motion since
an injury while exercising 1 month ago.

EXAM:
MRI OF THE RIGHT KNEE WITHOUT CONTRAST
TECHNIQUE: Multiplanar, multisequence MR imaging of the knee was performed. No
intravenous contrast was administered.

[Series 6: PD fat-sat · axial · right · 3.0mm · 0.42mm/px · z∈[-30,+97]mm · 8 of 40 slices shown (1 of 3)]
[im 1/40]
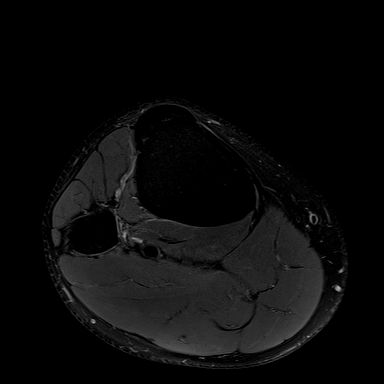
[im 6/40]
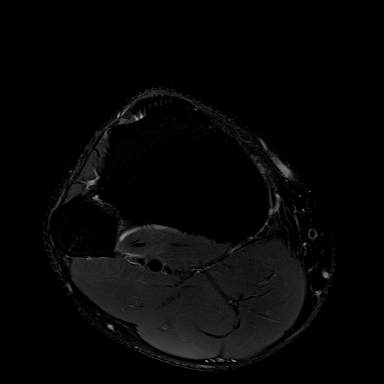
[im 12/40]
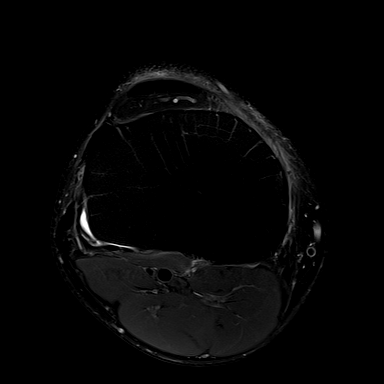
[im 17/40]
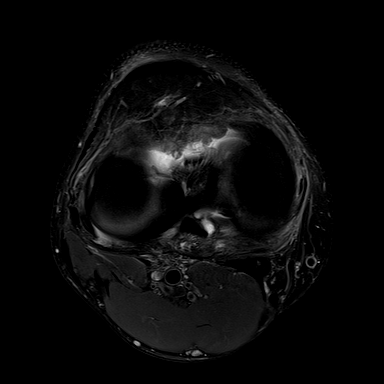
[im 23/40]
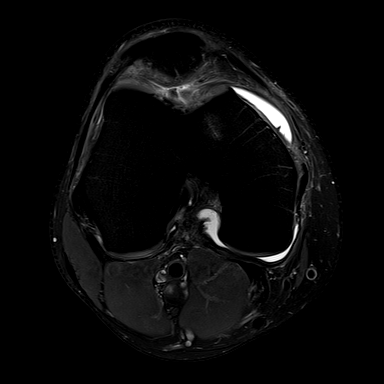
[im 28/40]
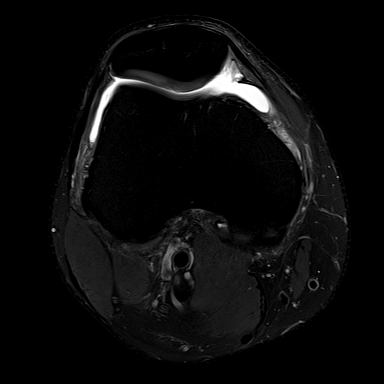
[im 34/40]
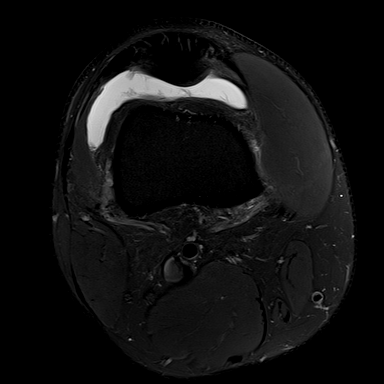
[im 40/40]
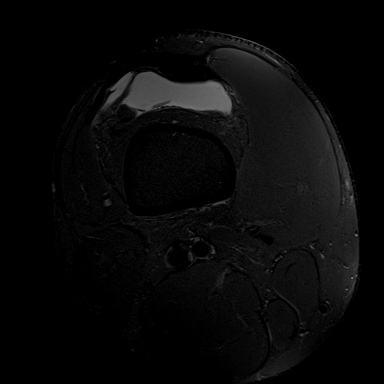

[Series 8: PD fat-sat · coronal · right · 3.0mm · 0.36mm/px · 7 of 34 slices shown (2 of 3)]
[im 1/34]
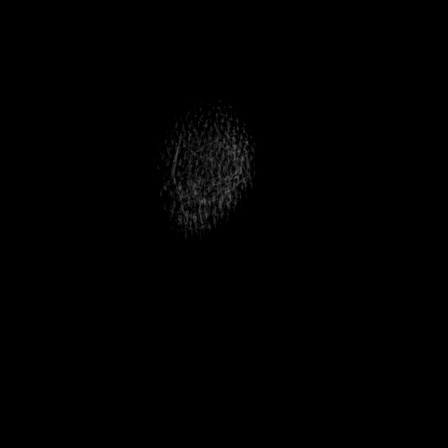
[im 6/34]
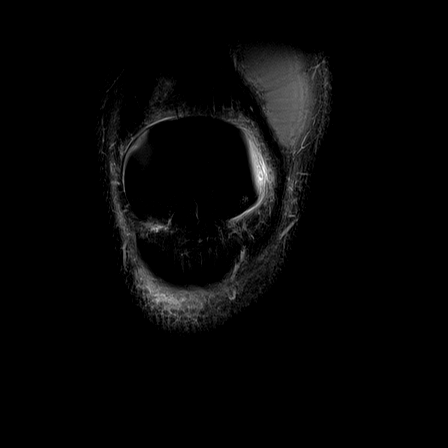
[im 12/34]
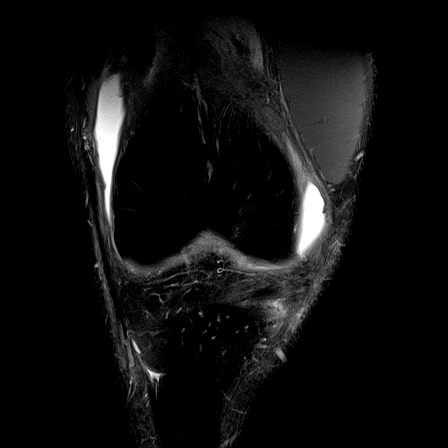
[im 17/34]
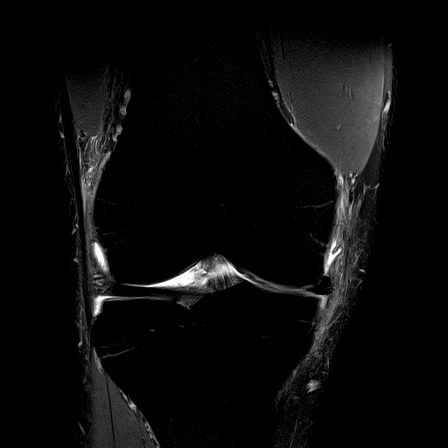
[im 23/34]
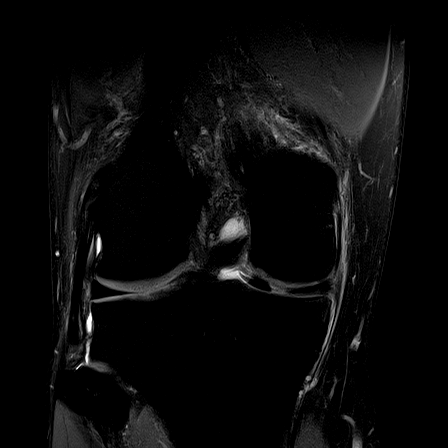
[im 28/34]
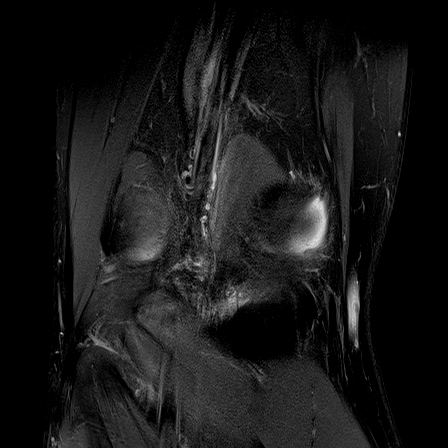
[im 34/34]
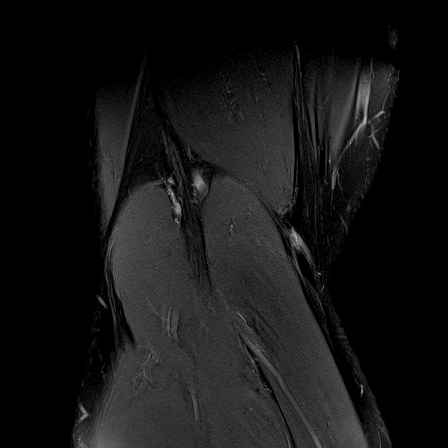

[Series 9: T2 fat-sat · coronal · right · 3.0mm · 0.42mm/px · 7 of 34 slices shown]
[im 1/34]
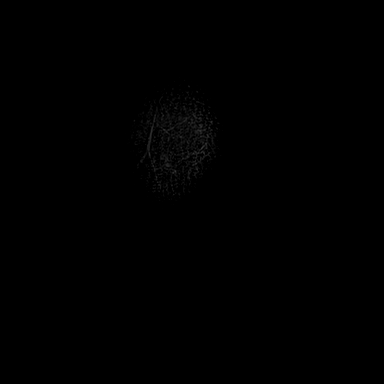
[im 6/34]
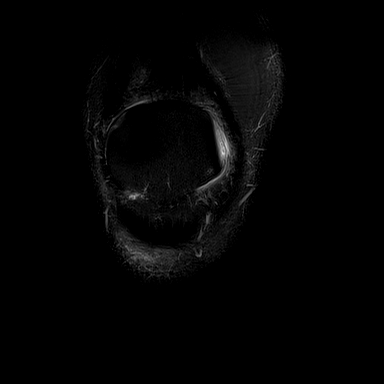
[im 12/34]
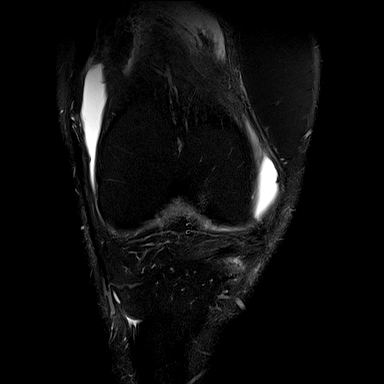
[im 17/34]
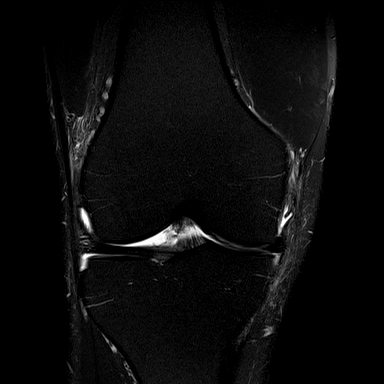
[im 23/34]
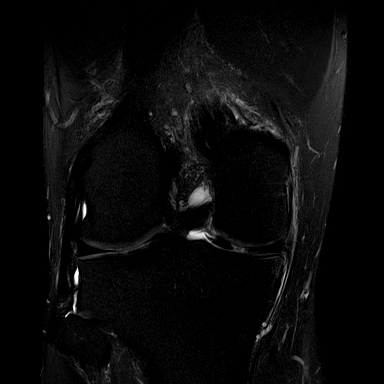
[im 28/34]
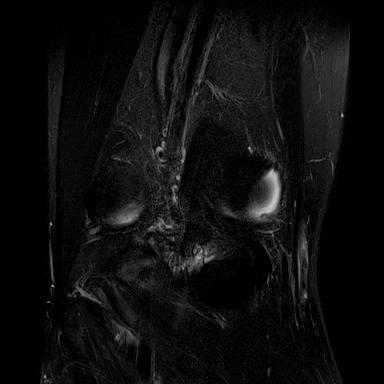
[im 34/34]
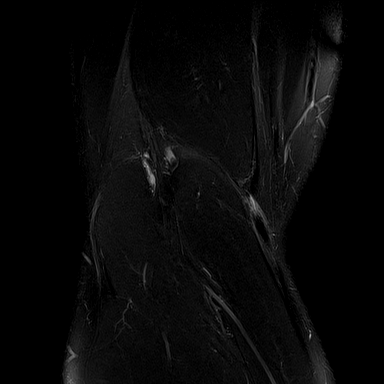

[Series 10: PD fat-sat · sagittal · right · 3.0mm · 0.42mm/px · 6 of 32 slices shown (3 of 3)]
[im 1/32]
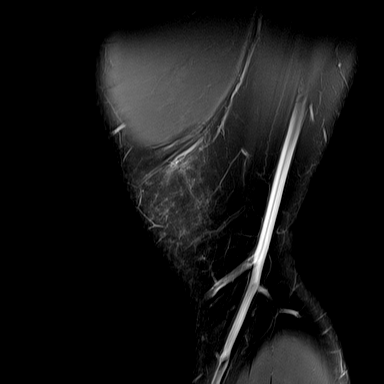
[im 7/32]
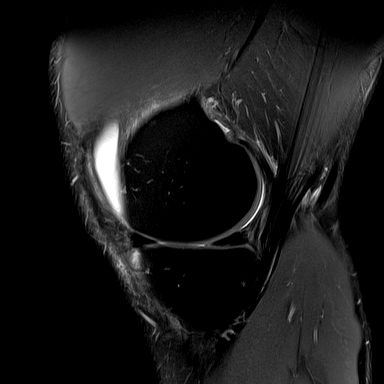
[im 13/32]
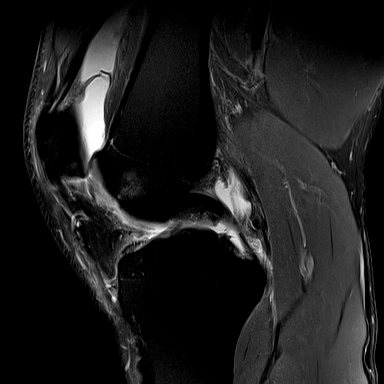
[im 19/32]
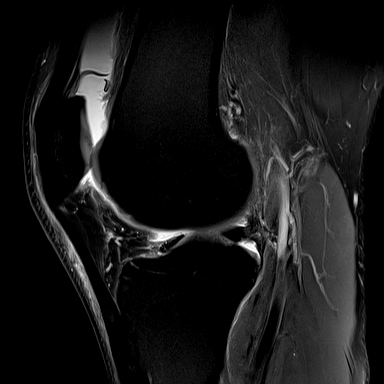
[im 25/32]
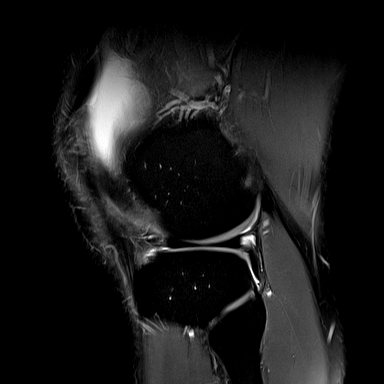
[im 32/32]
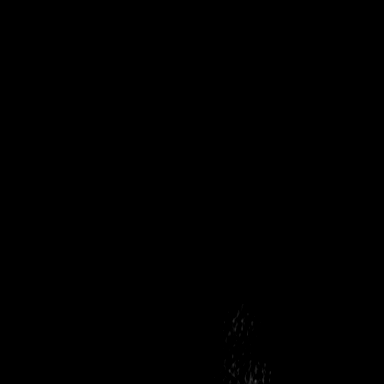

[Series 11: PD · coronal · right · 1.5mm · 0.44mm/px · 5 of 24 slices shown]
[im 1/24]
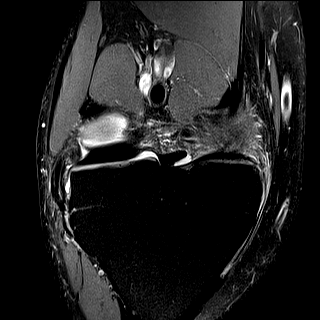
[im 6/24]
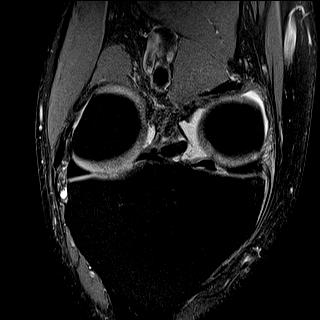
[im 12/24]
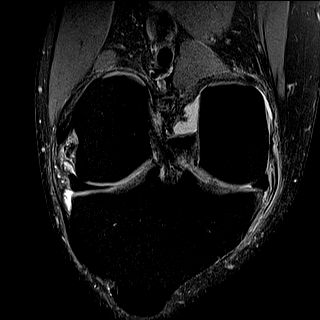
[im 18/24]
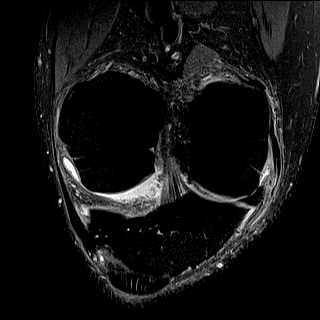
[im 24/24]
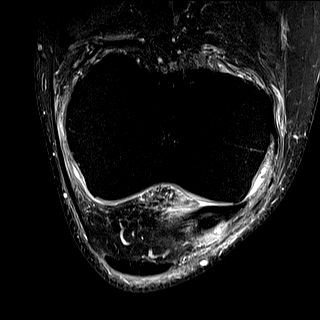

[33 of 40 positions shown; findings below may reference images not displayed]

FINDINGS: MENISCI

Medial meniscus: There is a displaced tear of the posterior horn and
midbody of the medial meniscus. The displaced component is flipped
adjacent to the root of the posterior horn.

Lateral meniscus:  Normal.

LIGAMENTS

Cruciates:  Intact ACL and PCL.

Collaterals: Edema superficial and deep to the MCL is felt to be due
to the underlying meniscal tear.

CARTILAGE

Patellofemoral: Minimal chondromalacia of the lateral facet of the
patella. Focal grade 4 chondromalacia of the medial aspect of the
trochlear groove of the distal femur.

Medial:  Slight thinning of the articular cartilage.

Lateral:  Normal.

Joint:  Large joint effusion.

Popliteal Fossa:  No Baker cyst. Intact popliteus tendon.

Extensor Mechanism:  Intact quadriceps tendon and patellar tendon.

Bones: No focal marrow signal abnormality. No fracture or
dislocation.

Other: None
IMPRESSION: Displaced tear of the posterior horn and midbody of the medial
meniscus. The torn component is flipped adjacent to the root of the
posterior horn.

Slight chondromalacia of the patellofemoral compartment.

## 2019-10-30 ENCOUNTER — Other Ambulatory Visit: Payer: Self-pay | Admitting: Urology

## 2019-10-30 DIAGNOSIS — E291 Testicular hypofunction: Secondary | ICD-10-CM

## 2020-06-17 ENCOUNTER — Other Ambulatory Visit
Admission: RE | Admit: 2020-06-17 | Discharge: 2020-06-17 | Disposition: A | Discharge status: Home / Self Care | Payer: BLUE CROSS/BLUE SHIELD | Source: Ambulatory Visit | Attending: Pediatrics | Admitting: Pediatrics

## 2020-06-17 DIAGNOSIS — R079 Chest pain, unspecified: Secondary | ICD-10-CM | POA: Insufficient documentation

## 2020-06-17 DIAGNOSIS — Z03818 Encounter for observation for suspected exposure to other biological agents ruled out: Secondary | ICD-10-CM | POA: Insufficient documentation

## 2020-06-17 DIAGNOSIS — R0602 Shortness of breath: Secondary | ICD-10-CM | POA: Diagnosis present

## 2020-06-17 LAB — FIBRIN DERIVATIVES D-DIMER (ARMC ONLY): Fibrin derivatives D-dimer (ARMC): 234.48 ng/mL (FEU) (ref 0.00–499.00)

## 2021-10-06 DIAGNOSIS — D751 Secondary polycythemia: Secondary | ICD-10-CM | POA: Insufficient documentation

## 2022-09-28 ENCOUNTER — Other Ambulatory Visit: Payer: Self-pay | Admitting: Sports Medicine

## 2022-09-28 DIAGNOSIS — M19011 Primary osteoarthritis, right shoulder: Secondary | ICD-10-CM

## 2022-10-05 ENCOUNTER — Ambulatory Visit: Payer: BLUE CROSS/BLUE SHIELD

## 2024-09-28 ENCOUNTER — Ambulatory Visit: Payer: Self-pay | Admitting: Dermatology

## 2024-09-28 ENCOUNTER — Encounter: Payer: Self-pay | Admitting: Dermatology

## 2024-09-28 DIAGNOSIS — D229 Melanocytic nevi, unspecified: Secondary | ICD-10-CM

## 2024-09-28 DIAGNOSIS — L578 Other skin changes due to chronic exposure to nonionizing radiation: Secondary | ICD-10-CM | POA: Diagnosis not present

## 2024-09-28 DIAGNOSIS — L814 Other melanin hyperpigmentation: Secondary | ICD-10-CM

## 2024-09-28 DIAGNOSIS — E782 Mixed hyperlipidemia: Secondary | ICD-10-CM | POA: Insufficient documentation

## 2024-09-28 DIAGNOSIS — L821 Other seborrheic keratosis: Secondary | ICD-10-CM

## 2024-09-28 DIAGNOSIS — W908XXA Exposure to other nonionizing radiation, initial encounter: Secondary | ICD-10-CM | POA: Diagnosis not present

## 2024-09-28 DIAGNOSIS — I10 Essential (primary) hypertension: Secondary | ICD-10-CM | POA: Insufficient documentation

## 2024-09-28 DIAGNOSIS — Z1283 Encounter for screening for malignant neoplasm of skin: Secondary | ICD-10-CM | POA: Diagnosis not present

## 2024-09-28 DIAGNOSIS — D1801 Hemangioma of skin and subcutaneous tissue: Secondary | ICD-10-CM

## 2024-09-28 DIAGNOSIS — Z86018 Personal history of other benign neoplasm: Secondary | ICD-10-CM

## 2024-09-28 DIAGNOSIS — Z872 Personal history of diseases of the skin and subcutaneous tissue: Secondary | ICD-10-CM

## 2024-09-28 NOTE — Progress Notes (Signed)
   New Patient Visit   Subjective  Caleb Peters is a 48 y.o. male who presents for the following: Skin Cancer Screening and Full Body Skin Exam, hx of Dysplastic Nevus, Rash bil lower legs comes and goes mainly in summer, not itchy or painful, clear today, check freckle L shoulder, no hx of skin cancer, not sure if any fhx of skin cancer  The patient presents for Total-Body Skin Exam (TBSE) for skin cancer screening and mole check. The patient has spots, moles and lesions to be evaluated, some may be new or changing and the patient may have concern these could be cancer.    The following portions of the chart were reviewed this encounter and updated as appropriate: medications, allergies, medical history  Review of Systems:  No other skin or systemic complaints except as noted in HPI or Assessment and Plan.  Objective  Well appearing patient in no apparent distress; mood and affect are within normal limits.  A full examination was performed including scalp, head, eyes, ears, nose, lips, neck, chest, axillae, abdomen, back, buttocks, bilateral upper extremities, bilateral lower extremities, hands, feet, fingers, toes, fingernails, and toenails. All findings within normal limits unless otherwise noted below.   Relevant physical exam findings are noted in the Assessment and Plan.    Assessment & Plan   SKIN CANCER SCREENING PERFORMED TODAY.  ACTINIC DAMAGE - Chronic condition, secondary to cumulative UV/sun exposure - diffuse scaly erythematous macules with underlying dyspigmentation - Recommend daily broad spectrum sunscreen SPF 30+ to sun-exposed areas, reapply every 2 hours as needed.  - Staying in the shade or wearing long sleeves, sun glasses (UVA+UVB protection) and wide brim hats (4-inch brim around the entire circumference of the hat) are also recommended for sun protection.  - Call for new or changing lesions.  LENTIGINES, SEBORRHEIC KERATOSES, HEMANGIOMAS - Benign normal  skin lesions - Benign-appearing - Call for any changes - SK L top of shoulder  MELANOCYTIC NEVI - Tan-brown and/or pink-flesh-colored symmetric macules and papules - Benign appearing on exam today - Observation - Call clinic for new or changing moles - Recommend daily use of broad spectrum spf 30+ sunscreen to sun-exposed areas.   HISTORY OF DYSPLASTIC NEVUS No evidence of recurrence today Recommend regular full body skin exams Recommend daily broad spectrum sunscreen SPF 30+ to sun-exposed areas, reapply every 2 hours as needed.  Call if any new or changing lesions are noted between office visits  - L umbilicus  HISTORY of RASH, non symptomatic  Comes and goes per patient Bil lower legs Exam: lower legs clear today  Treatment Plan: Advised patient to return to clinic when flared for evaluation.  MULTIPLE BENIGN NEVI   LENTIGINES   ACTINIC ELASTOSIS   SEBORRHEIC KERATOSES   CHERRY ANGIOMA    Return in about 1 year (around 09/28/2025) for TBSE, Hx of Dysplastic nevi.  I, Grayce Saunas, RMA, am acting as scribe for Boneta Sharps, MD .   Documentation: I have reviewed the above documentation for accuracy and completeness, and I agree with the above.  Boneta Sharps, MD

## 2024-09-28 NOTE — Patient Instructions (Addendum)

## 2025-10-01 ENCOUNTER — Ambulatory Visit: Admitting: Dermatology
